# Patient Record
Sex: Male | Born: 2000 | Hispanic: No | Marital: Single | Smoking: Never smoker
Health system: Southern US, Community
[De-identification: ages and names within clinical notes are randomized; demographics above are authoritative.]

## PROBLEM LIST (undated history)

## (undated) HISTORY — PX: WISDOM TOOTH EXTRACTION: SHX21

---
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
## 2019-01-19 ENCOUNTER — Encounter (HOSPITAL_COMMUNITY): Payer: Self-pay

## 2019-01-19 ENCOUNTER — Other Ambulatory Visit: Payer: Self-pay

## 2019-01-19 ENCOUNTER — Emergency Department (HOSPITAL_COMMUNITY): Payer: Medicaid Other

## 2019-01-19 DIAGNOSIS — Y9366 Activity, soccer: Secondary | ICD-10-CM | POA: Diagnosis not present

## 2019-01-19 DIAGNOSIS — Y998 Other external cause status: Secondary | ICD-10-CM | POA: Diagnosis not present

## 2019-01-19 DIAGNOSIS — S8992XA Unspecified injury of left lower leg, initial encounter: Secondary | ICD-10-CM | POA: Diagnosis present

## 2019-01-19 DIAGNOSIS — Y929 Unspecified place or not applicable: Secondary | ICD-10-CM | POA: Diagnosis not present

## 2019-01-19 DIAGNOSIS — W500XXA Accidental hit or strike by another person, initial encounter: Secondary | ICD-10-CM | POA: Diagnosis not present

## 2019-01-19 NOTE — ED Triage Notes (Signed)
Pt presents to the ED with complaints of left knee pain x 1 week after playing soccer.

## 2019-01-20 ENCOUNTER — Encounter (HOSPITAL_COMMUNITY): Payer: Self-pay | Admitting: Student

## 2019-01-20 ENCOUNTER — Emergency Department (HOSPITAL_COMMUNITY)
Admission: EM | Admit: 2019-01-20 | Discharge: 2019-01-20 | Disposition: A | Discharge status: Home / Self Care | Payer: Medicaid Other | Attending: Emergency Medicine | Admitting: Emergency Medicine

## 2019-01-20 DIAGNOSIS — S8992XA Unspecified injury of left lower leg, initial encounter: Secondary | ICD-10-CM

## 2019-01-20 MED ORDER — NAPROXEN 500 MG PO TABS: 500.0000 mg | ORAL_TABLET | Freq: Two times a day (BID) | ORAL | 0 refills | Status: DC

## 2019-01-20 NOTE — ED Provider Notes (Signed)
[facility] EMERGENCY DEPARTMENT Provider Note   CSN: [id number] Arrival date & time: 01/19/19  2304     History   Chief Complaint Chief Complaint  Patient presents with  . Knee Pain    HPI Edgar Bates is a 18 y.o. male without significant past medical history who presents to the emergency department with complaints of left knee pain for the past 1 week.  Patient states he had an injury while playing soccer when another player hit him in the knee with their cleat.  States he is having moderate to severe pain, worse with movement, no alleviating factors.  Denies other areas of injury.  Denies numbness, tingling, weakness, or open wounds.     HPI  History reviewed. No pertinent past medical history.  There are no active problems to display for this patient.   Past Surgical History:  Procedure Laterality Date  . WISDOM TOOTH EXTRACTION          Home Medications    Prior to Admission medications   Not on File    Family History No family history on file.  Social History Social History   Tobacco Use  . Smoking status: Never Smoker  . Smokeless tobacco: Never Used  Substance Use Topics  . Alcohol use: No  . Drug use: No     Allergies   Patient has no known allergies.   Review of Systems Review of Systems  Constitutional: Negative for chills and fever.  Musculoskeletal: Positive for arthralgias.  Skin: Negative for wound.  Neurological: Negative for weakness and numbness.     Physical Exam Updated Vital Signs BP 137/84 (BP Location: Right Arm)   Pulse 73   Temp 98.6 F (37 C) (Oral)   Resp 18   Ht 5\' 7"  (1.702 m)   Wt 77.1 kg   SpO2 100%   BMI 26.63 kg/m   Physical Exam Vitals signs and nursing note reviewed.  Constitutional:      General: He is not in acute distress.    Appearance: He is not ill-appearing or toxic-appearing.  HENT:     Head: Normocephalic and atraumatic.  Cardiovascular:     Pulses:          Dorsalis pedis pulses  are 2+ on the right side and 2+ on the left side.       Posterior tibial pulses are 2+ on the right side and 2+ on the left side.  Pulmonary:     Effort: Pulmonary effort is normal.  Musculoskeletal:     Comments: Lower extremities: No obvious deformity, appreciable swelling, edema, erythema, ecchymosis, warmth, or open wounds. Patient has intact AROM to bilateral hips,  ankles, all digits, and the R knee, L knee with limitation with flexion- able to flex just past 90 degrees.  Tender palpation to the left patella, patellar tendon, and just medial to the patella.  No medial/lateral joint line tenderness.  No palpable joint instability.  Negative valgus, varus, and Lachman's.   Skin:    General: Skin is warm and dry.     Capillary Refill: Capillary refill takes less than 2 seconds.  Neurological:     Mental Status: He is alert.     Comments: Alert. Clear speech. Sensation grossly intact to bilateral lower extremities. 5/5 strength with knee flexion/extension & ankle plantar/dorsiflexion bilaterally. Patient ambulatory with antalgic gait.   Psychiatric:        Mood and Affect: Mood normal.        Behavior: Behavior  normal.      ED Treatments / Results  Labs (all labs ordered are listed, but only abnormal results are displayed) Labs Reviewed - No data to display  EKG None  Radiology Dg Knee Complete 4 Views Left  Result Date: 01/20/2019 CLINICAL DATA:  Left knee pain for 1 week EXAM: LEFT KNEE - COMPLETE 4+ VIEW COMPARISON:  None. FINDINGS: No evidence of fracture, dislocation, or joint effusion. No evidence of arthropathy or other focal bone abnormality. Soft tissues are unremarkable. IMPRESSION: Negative. Electronically Signed   By: Ulyses Jarred M.D.   On: 01/20/2019 00:14    Procedures Procedures (including critical care time)  Medications Ordered in ED Medications - No data to display   Initial Impression / Assessment and Plan / ED Course  I have reviewed the triage vital  signs and the nursing notes.  Pertinent labs & imaging results that were available during my care of the patient were reviewed by me and considered in my medical decision making (see chart for details).    Patient presents to the ED with complaints of pain to the  Left knee pain s/p injury 1 week prior. Exam without obvious deformity or open wounds. ROM with mild limitation. Tender to palpation anteriorly/medially. NVI distally. Xray negative for fracture/dislocation. Therapeutic knee immobilizer provided. PRICE  recommended.  Prescription for naproxen.  I discussed results, treatment plan, need for follow-up, and return precautions with the patient. Provided opportunity for questions, patient confirmed understanding and are in agreement with plan.    Final Clinical Impressions(s) / ED Diagnoses   Final diagnoses:  Injury of left knee, initial encounter    ED Discharge Orders         Ordered    naproxen (NAPROSYN) 500 MG tablet  2 times daily     01/20/19 0041           Treon Kehl, [place] R, PA-C 01/20/19 0043    Orpah Greek, MD 01/20/19 [phone]

## 2019-01-20 NOTE — Discharge Instructions (Signed)
Please read and follow all provided instructions.  You have been seen today for left knee pain after an injury.   Tests performed today include: An x-ray of the affected area - does NOT show any broken bones or dislocations.  Vital signs. See below for your results today.   Home care instructions: -- *PRICE in the first 24-48 hours  Protect (with brace, splint, sling), if given by your provider Rest Ice- Do not apply ice pack directly to your skin, place towel or similar between your skin and ice/ice pack. Apply ice for 20 min, then remove for 40 min while awake Compression- Wear brace, elastic bandage, splint as directed by your provider Elevate affected extremity above the level of your heart when not walking around for the first 24-48 hours   Medications: - Naproxen is a nonsteroidal anti-inflammatory medication that will help with pain and swelling. Be sure to take this medication as prescribed with food, 1 pill every 12 hours,  It should be taken with food, as it can cause stomach upset, and more seriously, stomach bleeding. Do not take other nonsteroidal anti-inflammatory medications with this such as Advil, Motrin, Aleve, Mobic, Goodie Powder, or Motrin.     You make take Tylenol per over the counter dosing with these medications.   We have prescribed you new medication(s) today. Discuss the medications prescribed today with your pharmacist as they can have adverse effects and interactions with your other medicines including over the counter and prescribed medications. Seek medical evaluation if you start to experience new or abnormal symptoms after taking one of these medicines, seek care immediately if you start to experience difficulty breathing, feeling of your throat closing, facial swelling, or rash as these could be indications of a more serious allergic reaction   Follow-up instructions: Please follow-up with your primary care provider or the provided orthopedic physician  (bone specialist) if you continue to have significant pain in 1 week. In this case you may have a more severe injury that requires further care.   Return instructions:  Please return if your digits or extremity are numb or tingling, appear gray or blue, or you have severe pain (also elevate the extremity and loosen splint or wrap if you were given one) Please return if you have redness or fevers.  Please return to the Emergency Department if you experience worsening symptoms.  Please return if you have any other emergent concerns. Additional Information:  Your vital signs today were: BP 137/84 (BP Location: Right Arm)    Pulse 73    Temp 98.6 F (37 C) (Oral)    Resp 18    Ht 5\' 7"  (1.702 m)    Wt 77.1 kg    SpO2 100%    BMI 26.63 kg/m  If your blood pressure (BP) was elevated above 135/85 this visit, please have this repeated by your doctor within one month. ---------------

## 2019-03-18 ENCOUNTER — Emergency Department (HOSPITAL_COMMUNITY): Payer: Medicaid Other

## 2019-03-18 ENCOUNTER — Other Ambulatory Visit: Payer: Self-pay

## 2019-03-18 ENCOUNTER — Encounter (HOSPITAL_COMMUNITY): Payer: Self-pay | Admitting: *Deleted

## 2019-03-18 ENCOUNTER — Emergency Department (HOSPITAL_COMMUNITY)
Admission: EM | Admit: 2019-03-18 | Discharge: 2019-03-19 | Disposition: A | Discharge status: Home / Self Care | Payer: Medicaid Other | Attending: Emergency Medicine | Admitting: Emergency Medicine

## 2019-03-18 DIAGNOSIS — Z79899 Other long term (current) drug therapy: Secondary | ICD-10-CM | POA: Insufficient documentation

## 2019-03-18 DIAGNOSIS — R0789 Other chest pain: Secondary | ICD-10-CM | POA: Insufficient documentation

## 2019-03-18 DIAGNOSIS — R079 Chest pain, unspecified: Secondary | ICD-10-CM | POA: Diagnosis present

## 2019-03-18 LAB — BASIC METABOLIC PANEL
Anion gap: 9 (ref 5–15)
BUN: 18 mg/dL (ref 6–20)
CO2: 24 mmol/L (ref 22–32)
Calcium: 9.1 mg/dL (ref 8.9–10.3)
Chloride: 104 mmol/L (ref 98–111)
Creatinine, Ser: 0.93 mg/dL (ref 0.61–1.24)
GFR calc Af Amer: >60 mL/min (ref >60)
GFR calc non Af Amer: >60 mL/min (ref >60)
Glucose, Bld: 95 mg/dL (ref 70–99)
Potassium: 3.9 mmol/L (ref 3.5–5.1)
Sodium: 137 mmol/L (ref 135–145)

## 2019-03-18 LAB — CBC WITH DIFFERENTIAL/PLATELET
Abs Immature Granulocytes: 0.03 10*3/uL (ref 0.00–0.07)
Basophils Absolute: 0.1 10*3/uL (ref 0.0–0.1)
Basophils Relative: 1 %
Eosinophils Absolute: 0.3 10*3/uL (ref 0.0–0.5)
Eosinophils Relative: 3 %
HCT: 49.4 % (ref 39.0–52.0)
Hemoglobin: 16.3 g/dL (ref 13.0–17.0)
Immature Granulocytes: 0 %
Lymphocytes Relative: 40 %
Lymphs Abs: 3.5 10*3/uL (ref 0.7–4.0)
MCH: 30.8 pg (ref 26.0–34.0)
MCHC: 33 g/dL (ref 30.0–36.0)
MCV: 93.4 fL (ref 80.0–100.0)
Monocytes Absolute: 0.8 10*3/uL (ref 0.1–1.0)
Monocytes Relative: 9 %
Neutro Abs: 4.1 10*3/uL (ref 1.7–7.7)
Neutrophils Relative %: 47 %
Platelets: 349 10*3/uL (ref 150–400)
RBC: 5.29 MIL/uL (ref 4.22–5.81)
RDW: 12.4 % (ref 11.5–15.5)
WBC: 8.8 10*3/uL (ref 4.0–10.5)
nRBC: 0 % (ref 0.0–0.2)

## 2019-03-18 LAB — D-DIMER, QUANTITATIVE (NOT AT ARMC): D-Dimer, Quant: <0.27 ug/mL-FEU (ref 0.00–0.50)

## 2019-03-18 LAB — TROPONIN I (HIGH SENSITIVITY): Troponin I (High Sensitivity): 2 ng/L (ref <18)

## 2019-03-18 NOTE — Discharge Instructions (Addendum)
There is no evidence of heart attack or blood clot in the lung.  Establish care with a primary doctor.  Return to the ED for chest pain becomes exertional, associated with shortness of breath, nausea, vomiting, sweating, last longer than 15 minutes or any other concerns.  Take acetaminophen or ibuprofen as needed for pain.

## 2019-03-18 NOTE — ED Notes (Signed)
ekg given to Dr Rancour 

## 2019-03-18 NOTE — ED Provider Notes (Signed)
[facility] EMERGENCY DEPARTMENT Provider Note   CSN: [id number] Arrival date & time: 03/18/19  2031     History   Chief Complaint Chief Complaint  Patient presents with  . Chest Pain    HPI Edgar Bates is a 18 y.o. male.     Patient here with intermittent chest pain since 7 AM.  Describes pain to the left side of his chest that feels like a sharp tightness that comes and goes.  It lasts about 2 to 3 minutes at a time.  He is uncertain vomiting times the pain has come on today.  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  Denies any lifting injury.  Has not noticed anything make it better or worse.  There is no radiation to his arm, back or neck.  No shortness of breath, cough or fever.  No abdominal pain, nausea, vomiting.  Denies any cardiac history.  Denies any cocaine use.  States he had this pain several years ago but does not know what caused it.  He is not have any pain currently.  The pain is been coming and going lasting for 2 or 3 minutes at a time.  The history is provided by the patient.  Chest Pain Associated symptoms: no abdominal pain, no dizziness, no fever, no headache, no nausea, no shortness of breath, no vomiting and no weakness     History reviewed. No pertinent past medical history.  There are no active problems to display for this patient.   Past Surgical History:  Procedure Laterality Date  . WISDOM TOOTH EXTRACTION          Home Medications    Prior to Admission medications   Medication Sig Start Date End Date Taking? Authorizing Provider  naproxen (NAPROSYN) 500 MG tablet Take 1 tablet (500 mg total) by mouth 2 (two) times daily. 01/20/19   Petrucelli, Glynda Jaeger, PA-C    Family History No family history on file.  Social History Social History   Tobacco Use  . Smoking status: Never Smoker  . Smokeless tobacco: Never Used  Substance Use Topics  . Alcohol use: No  . Drug use: No     Allergies    Patient has no known allergies.   Review of Systems Review of Systems  Constitutional: Negative for activity change, appetite change and fever.  HENT: Negative for congestion and rhinorrhea.   Eyes: Negative for visual disturbance.  Respiratory: Positive for chest tightness. Negative for shortness of breath.   Cardiovascular: Positive for chest pain.  Gastrointestinal: Negative for abdominal pain, nausea and vomiting.  Genitourinary: Negative for dysuria and hematuria.  Musculoskeletal: Negative for arthralgias and myalgias.  Skin: Negative for rash.  Neurological: Negative for dizziness, weakness and headaches.   all other systems are negative except as noted in the HPI and PMH.     Physical Exam Updated Vital Signs BP 128/84 (BP Location: Right Arm)   Pulse 66   Temp 98.4 F (36.9 C) (Oral)   Resp 18   Ht 5\' 7"  (1.702 m)   SpO2 98%   BMI 26.63 kg/m   Physical Exam Vitals signs and nursing note reviewed.  Constitutional:      General: He is not in acute distress.    Appearance: He is well-developed.  HENT:     Head: Normocephalic and atraumatic.     Mouth/Throat:     Pharynx: No oropharyngeal exudate.  Eyes:  Conjunctiva/sclera: Conjunctivae normal.     Pupils: Pupils are equal, round, and reactive to light.  Neck:     Musculoskeletal: Normal range of motion and neck supple.     Comments: No meningismus. Cardiovascular:     Rate and Rhythm: Normal rate and regular rhythm.     Heart sounds: Normal heart sounds. No murmur.  Pulmonary:     Effort: Pulmonary effort is normal. No respiratory distress.     Breath sounds: Normal breath sounds.  Chest:     Chest wall: No tenderness.  Abdominal:     Palpations: Abdomen is soft.     Tenderness: There is no abdominal tenderness. There is no guarding or rebound.  Musculoskeletal: Normal range of motion.        General: No tenderness.  Skin:    General: Skin is warm.  Neurological:     Mental Status: He is alert  and oriented to person, place, and time.     Cranial Nerves: No cranial nerve deficit.     Motor: No abnormal muscle tone.     Coordination: Coordination normal.     Comments: No ataxia on finger to nose bilaterally. No pronator drift. 5/5 strength throughout. CN 2-12 intact.Equal grip strength. Sensation intact.   Psychiatric:        Behavior: Behavior normal.      ED Treatments / Results  Labs (all labs ordered are listed, but only abnormal results are displayed) Labs Reviewed  CBC WITH DIFFERENTIAL/PLATELET  BASIC METABOLIC PANEL  D-DIMER, QUANTITATIVE (NOT AT [facility])  TROPONIN I (HIGH SENSITIVITY)  TROPONIN I (HIGH SENSITIVITY)    EKG None  Radiology Dg Chest 2 View  Result Date: 03/18/2019 CLINICAL DATA:  18 year old male with intermittent left side chest pain. EXAM: CHEST - 2 VIEW COMPARISON:  Chest radiographs 06/06/2003. FINDINGS: The heart size and mediastinal contours are within normal limits. Normal lung volumes. Both lungs are clear. No pneumothorax or pleural effusion. Visualized tracheal air column is within normal limits. Negative visible bowel gas and osseous structures. IMPRESSION: Negative.  No cardiopulmonary abnormality. Electronically Signed   By: Odessa Fleming M.D.   On: 03/18/2019 21:28    Procedures Procedures (including critical care time)  Medications Ordered in ED Medications - No data to display   Initial Impression / Assessment and Plan / ED Course  I have reviewed the triage vital signs and the nursing notes.  Pertinent labs & imaging results that were available during my care of the patient were reviewed by me and considered in my medical decision making (see chart for details).       Intermittent chest pain throughout the day today.  Lasting for several minutes at a time.  Not exertional or pleuritic.  He is not having any chest pain currently.  EKG is sinus rhythm without any ST changes.  Chest pain description is atypical for ACS.  Chest  x-ray is negative, D-dimer and troponin are negative.  Low suspicion for ACS, PE, or dissection.  Second troponin pending at time of shift change.  Dr. Preston Fleeting to assume care.  ED ECG REPORT   Date: 03/18/2019  Rate: 75  Rhythm: normal sinus rhythm  QRS Axis: normal  Intervals: normal  ST/T Wave abnormalities: normal  Conduction Disutrbances:none  Narrative Interpretation:   Old EKG Reviewed: none available  I have personally reviewed the EKG tracing and agree with the computerized printout as noted.   Final Clinical Impressions(s) / ED Diagnoses   Final diagnoses:  Atypical  chest pain    ED Discharge Orders    None       Aymar Whitfill, Jeannett SeniorStephen, MD 03/18/19 2356

## 2019-03-18 NOTE — ED Triage Notes (Signed)
Pt c/o left side intermittent chest pain, non radiating, denies any n/v,

## 2019-03-19 LAB — TROPONIN I (HIGH SENSITIVITY): Troponin I (High Sensitivity): <2 ng/L (ref <18)

## 2019-03-19 NOTE — ED Provider Notes (Signed)
Care assumed from Dr. Kingsley Callander, patient presenting with abdominal pain and negative initial work-up pending second troponin.  Repeat troponin is normal.  Patient is felt to be safe for discharge.  No evidence of any serious pathology.  Patient is advised of these findings, advised to pursue symptomatic treatment.  Return precautions discussed.   Delora Fuel, MD 58/09/98 [phone]

## 2019-03-24 ENCOUNTER — Other Ambulatory Visit: Payer: Self-pay

## 2019-03-24 DIAGNOSIS — Z20822 Contact with and (suspected) exposure to covid-19: Secondary | ICD-10-CM

## 2019-03-26 LAB — NOVEL CORONAVIRUS, NAA: SARS-CoV-2, NAA: NOT DETECTED

## 2019-03-27 ENCOUNTER — Telehealth: Payer: Self-pay | Admitting: *Deleted

## 2019-03-27 NOTE — Telephone Encounter (Signed)
Patient needs a copy of test results for his work. MyChart- instruction given

## 2019-08-23 ENCOUNTER — Other Ambulatory Visit: Payer: Self-pay

## 2019-08-23 ENCOUNTER — Emergency Department (HOSPITAL_COMMUNITY)
Admission: EM | Admit: 2019-08-23 | Discharge: 2019-08-23 | Disposition: A | Discharge status: Home / Self Care | Payer: Medicaid Other | Attending: Emergency Medicine | Admitting: Emergency Medicine

## 2019-08-23 ENCOUNTER — Emergency Department (HOSPITAL_COMMUNITY): Payer: Medicaid Other

## 2019-08-23 ENCOUNTER — Encounter (HOSPITAL_COMMUNITY): Payer: Self-pay | Admitting: Emergency Medicine

## 2019-08-23 DIAGNOSIS — Z79899 Other long term (current) drug therapy: Secondary | ICD-10-CM | POA: Insufficient documentation

## 2019-08-23 DIAGNOSIS — M79671 Pain in right foot: Secondary | ICD-10-CM | POA: Diagnosis present

## 2019-08-23 MED ORDER — NAPROXEN 500 MG PO TABS: 500.0000 mg | ORAL_TABLET | Freq: Two times a day (BID) | ORAL | 0 refills | Status: AC

## 2019-08-23 NOTE — ED Triage Notes (Signed)
Playing soccer and noted pain to his R heel   Does not know of any injury but wonders if "I might have stepped wrong"  Here for eval

## 2019-08-23 NOTE — ED Provider Notes (Signed)
[facility] EMERGENCY DEPARTMENT Provider Note   CSN: [id number] Arrival date & time: 08/23/19  1929     History Chief Complaint  Patient presents with  . Foot Pain    R heel    Edgar Bates is a 19 y.o. male without significant past medical hx who presents to the ED with complaints of right foot pain that began today while he was playing soccer.  He states he thinks he stepped wrong and has had discomfort to the lateral aspect of the heel since.  States pain is an 8 out of 10 in severity, worse with weightbearing, no alleviating factors.  Denies numbness, tingling, weakness, or other areas of injury.  HPI     History reviewed. No pertinent past medical history.  There are no problems to display for this patient.   Past Surgical History:  Procedure Laterality Date  . WISDOM TOOTH EXTRACTION         No family history on file.  Social History   Tobacco Use  . Smoking status: Never Smoker  . Smokeless tobacco: Never Used  Substance Use Topics  . Alcohol use: No  . Drug use: No    Home Medications Prior to Admission medications   Medication Sig Start Date End Date Taking? Authorizing Provider  naproxen (NAPROSYN) 500 MG tablet Take 1 tablet (500 mg total) by mouth 2 (two) times daily. 01/20/19   Sergio Zawislak, Pleas Koch, PA-C    Allergies    Patient has no known allergies.  Review of Systems   Review of Systems  Constitutional: Negative for chills and fever.  Respiratory: Negative for shortness of breath.   Cardiovascular: Negative for chest pain.  Musculoskeletal: Positive for arthralgias.  Skin: Negative for wound.  Neurological: Negative for weakness and numbness.    Physical Exam Updated Vital Signs BP 133/77 (BP Location: Right Arm)   Pulse 87   Temp 98.3 F (36.8 C) (Oral)   Resp 18   Ht 5\' 6"  (1.676 m)   Wt 77.1 kg   SpO2 98%   BMI 27.44 kg/m   Physical Exam Vitals and nursing note reviewed.  Constitutional:      General: He is not in  acute distress.    Appearance: He is not ill-appearing or toxic-appearing.  HENT:     Head: Normocephalic and atraumatic.  Cardiovascular:     Pulses:          Dorsalis pedis pulses are 2+ on the right side and 2+ on the left side.       Posterior tibial pulses are 2+ on the right side and 2+ on the left side.  Pulmonary:     Effort: Pulmonary effort is normal.  Musculoskeletal:     Comments: Lower extremities: No obvious deformity, appreciable swelling, edema, erythema, ecchymosis, warmth, or open wounds. Patient has intact AROM to bilateral hips, knees, ankles, and all digits. Tender to palpation to the lateral aspect of the calcaneus of the right foot as well as to the calcaneofibular ligament.  Lower extremities are otherwise nontender.  No tenderness to the base of the fifth, navicular bone, or medial/lateral malleolus.  Compartments are soft.  Skin:    General: Skin is warm and dry.     Capillary Refill: Capillary refill takes less than 2 seconds.  Neurological:     Mental Status: He is alert.     Comments: Alert. Clear speech. Sensation grossly intact to bilateral lower extremities. 5/5 strength with plantar/dorsiflexion bilaterally. Patient ambulatory with  antalgic gait.  Psychiatric:        Mood and Affect: Mood normal.        Behavior: Behavior normal.     ED Results / Procedures / Treatments   Labs (all labs ordered are listed, but only abnormal results are displayed) Labs Reviewed - No data to display  EKG None  Radiology DG Foot Complete Right  Result Date: 08/23/2019 CLINICAL DATA:  Right foot injury and pain while playing soccer. Initial encounter. EXAM: RIGHT FOOT COMPLETE - 3+ VIEW COMPARISON:  None. FINDINGS: There is no evidence of fracture or dislocation. There is no evidence of arthropathy or other focal bone abnormality. Soft tissues are unremarkable. IMPRESSION: Negative. Electronically Signed   By: Marlaine Hind M.D.   On: 08/23/2019 20:01     Procedures Procedures (including critical care time)  Medications Ordered in ED Medications - No data to display  ED Course  I have reviewed the triage vital signs and the nursing notes.  Pertinent labs & imaging results that were available during my care of the patient were reviewed by me and considered in my medical decision making (see chart for details).    MDM Rules/Calculators/A&P                     Patient presents to the emergency department with complaints of right foot pain after stepping normal playing soccer.  He is nontoxic, resting comfortably, vitals WNL.  No open wounds.  No signs of infection.  X-ray per triage has been personally reviewed and interpreted does not show any signs of fracture or dislocation.  Patient is neurovascularly intact distally.  Will place in ASO for support.  Recommended PRICE. Naproxen prescription for pain. I discussed results, treatment plan, need for follow-up, and return precautions with the patient. Provided opportunity for questions, patient confirmed understanding and is in agreement with plan.    Final Clinical Impression(s) / ED Diagnoses Final diagnoses:  Right foot pain    Rx / DC Orders ED Discharge Orders         Ordered    naproxen (NAPROSYN) 500 MG tablet  2 times daily     08/23/19 2032           Amaryllis Dyke, PA-C 08/23/19 2045    Noemi Chapel, MD 08/24/19 1433

## 2019-08-23 NOTE — Discharge Instructions (Addendum)
Please read and follow all provided instructions.  You have been seen today for right foot pain.   Tests performed today include: An x-ray of the affected area - does NOT show any broken bones or dislocations.  Vital signs. See below for your results today.   Home care instructions: -- *PRICE in the first 24-48 hours after injury: Protect (with brace, splint, sling), if given by your provider Rest Ice- Do not apply ice pack directly to your skin, place towel or similar between your skin and ice/ice pack. Apply ice for 20 min, then remove for 40 min while awake Compression- Wear brace, elastic bandage, splint as directed by your provider Elevate affected extremity above the level of your heart when not walking around for the first 24-48 hours   Medications:  - Naproxen is a nonsteroidal anti-inflammatory medication that will help with pain and swelling. Be sure to take this medication as prescribed with food, 1 pill every 12 hours,  It should be taken with food, as it can cause stomach upset, and more seriously, stomach bleeding. Do not take other nonsteroidal anti-inflammatory medications with this such as Advil, Motrin, Aleve, Mobic, Goodie Powder, or Motrin.    You make take Tylenol per over the counter dosing with these medications.   We have prescribed you new medication(s) today. Discuss the medications prescribed today with your pharmacist as they can have adverse effects and interactions with your other medicines including over the counter and prescribed medications. Seek medical evaluation if you start to experience new or abnormal symptoms after taking one of these medicines, seek care immediately if you start to experience difficulty breathing, feeling of your throat closing, facial swelling, or rash as these could be indications of a more serious allergic reaction   Follow-up instructions: Please follow-up with your primary care provider or the provided orthopedic physician (bone  specialist) if you continue to have significant pain in 1 week. In this case you may have a more severe injury that requires further care.   Return instructions:  Please return if your digits or extremity are numb or tingling, appear gray or blue, or you have severe pain (also elevate the extremity and loosen splint or wrap if you were given one) Please return if you have redness or fevers.  Please return to the Emergency Department if you experience worsening symptoms.  Please return if you have any other emergent concerns. Additional Information:  Your vital signs today were: BP 133/77 (BP Location: Right Arm)   Pulse 87   Temp 98.3 F (36.8 C) (Oral)   Resp 18   Ht 5\' 6"  (1.676 m)   Wt 77.1 kg   SpO2 98%   BMI 27.44 kg/m  If your blood pressure (BP) was elevated above 135/85 this visit, please have this repeated by your doctor within one month. ---------------
